# Patient Record
Sex: Male | Born: 2000 | Race: Black or African American | Hispanic: No | Marital: Single | Smoking: Never smoker
Health system: Southern US, Community
[De-identification: ages and names within clinical notes are randomized; demographics above are authoritative.]

## PROBLEM LIST (undated history)

## (undated) HISTORY — PX: TENDON REPAIR: SHX5111

---
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
## 2019-06-12 ENCOUNTER — Other Ambulatory Visit: Payer: Self-pay

## 2019-06-13 ENCOUNTER — Ambulatory Visit: Payer: No Typology Code available for payment source | Admitting: Physician Assistant

## 2019-06-13 ENCOUNTER — Encounter: Payer: Self-pay | Admitting: Physician Assistant

## 2019-06-13 VITALS — BP 116/63 | HR 54 | Temp 97.2°F | Ht 69.0 in | Wt 205.2 lb

## 2019-06-13 DIAGNOSIS — Z00129 Encounter for routine child health examination without abnormal findings: Secondary | ICD-10-CM

## 2019-06-13 DIAGNOSIS — Z0000 Encounter for general adult medical examination without abnormal findings: Secondary | ICD-10-CM | POA: Diagnosis not present

## 2019-06-14 NOTE — Progress Notes (Signed)
Adolescent Well Care Visit Andrew Austin is a 18 y.o. male who is here for well care.    PCP:  Remus LofflerJones, Orlanda Lemmerman S, PA-C   History was provided by the patient.  Current Issues: Current concerns include Going to secondary school that will update his classes and be a football prep program. He hops to move on to higher level..   Nutrition: Nutrition/Eating Behaviors: good Adequate calcium in diet?: good Supplements/ Vitamins: no  Exercise/ Media: Play any Sports?/ Exercise: football, high level Screen Time:  < 2 hours Media Rules or Monitoring?: no  Sleep:  Sleep: good  Social Screening: Lives with:  parents Parental relations:  good Activities, Work, and Regulatory affairs officerChores?: yes Concerns regarding behavior with peers?  no Stressors of note: no  Education: School Name: completed high school  School Grade: finished School performance: doing well; no concerns School Behavior: doing well; no concerns  Confidential Social History: Tobacco?  no Secondhand smoke exposure?  no Drugs/ETOH?  no  Sexually Active?  no   Pregnancy Prevention: discussed  Safe at home, in school & in relationships?  Yes Safe to self?  Yes   Screenings: Patient has a dental home: yes  The patient completed the Rapid Assessment for Adolescent Preventive Services screening questionnaire and the following topics were identified as risk factors and discussed: condom use and mental health issues  In addition, the following topics were discussed as part of anticipatory guidance healthy eating and exercise.  PHQ-9 completed and results indicated  [facility] 06/13/2019 08/12/2017 06/22/2017 02/17/2017 08/09/2016  Decreased Interest 0 0 0 1 0  Down, Depressed, Hopeless 0 0 0 0 0  PHQ - 2 Score 0 0 0 1 0  Altered sleeping 0 - 0 - -  Tired, decreased energy 0 - 0 - -  Change in appetite 0 - 0 - -  Feeling bad or failure about yourself  0 - 0 - -  Trouble concentrating 0 - 0 - -  Moving slowly or  fidgety/restless 0 - 0 - -  Suicidal thoughts 0 - 0 - -  PHQ-9 Score 0 - 0 - -     Physical Exam:  Vitals:   06/13/19 1419  BP: 116/63  Pulse: (!) 54  Temp: (!) 97.2 F (36.2 C)  TempSrc: Oral  Weight: 205 lb 3.2 oz (93.1 kg)  Height: 5\' 9"  (1.753 m)   BP 116/63   Pulse (!) 54   Temp (!) 97.2 F (36.2 C) (Oral)   Ht 5\' 9"  (1.753 m)   Wt 205 lb 3.2 oz (93.1 kg)   BMI 30.30 kg/m  Body mass index: body mass index is 30.3 kg/m. Blood pressure percentiles are not available for patients who are 18 years or older.   Hearing Screening   125Hz  250Hz  500Hz  1000Hz  2000Hz  3000Hz  4000Hz  6000Hz  8000Hz   Right ear:           Left ear:             Visual Acuity Screening   Right eye Left eye Both eyes  Without correction:     With correction: 20/20 20/40 20/20     General Appearance:   alert, oriented, no acute distress and well nourished  HENT: Normocephalic, no obvious abnormality, conjunctiva clear  Mouth:   Normal appearing teeth, no obvious discoloration, dental caries, or dental caps  Neck:   Supple; thyroid: no enlargement, symmetric, no tenderness/mass/nodules  Chest Normal, RRR without murmurs, rubs or galps  Lungs:   Clear to auscultation bilaterally, normal work of breathing  Heart:   Regular rate and rhythm, S1 and S2 normal, no murmurs;   Abdomen:   Soft, non-tender, no mass, or organomegaly  GU genitalia not examined  Musculoskeletal:   Tone and strength strong and symmetrical, all extremities               Lymphatic:   No cervical adenopathy  Skin/Hair/Nails:   Skin warm, dry and intact, no rashes, no bruises or petechiae  Neurologic:   Strength, gait, and coordination normal and age-appropriate     Assessment and Plan:   Well 17 year exam  BMI is appropriate for age  Hearing screening result:normal Vision screening result: normal  Counseling provided for all of the vaccine components   Declines vaccines at this time  No follow-ups on file.Terald Sleeper, PA-C

## 2020-06-13 ENCOUNTER — Ambulatory Visit (INDEPENDENT_AMBULATORY_CARE_PROVIDER_SITE_OTHER): Payer: Medicaid Other | Admitting: Nurse Practitioner

## 2020-06-13 ENCOUNTER — Encounter: Payer: Self-pay | Admitting: Nurse Practitioner

## 2020-06-13 ENCOUNTER — Other Ambulatory Visit: Payer: Self-pay

## 2020-06-13 VITALS — BP 125/68 | HR 65 | Temp 98.1°F | Ht 70.0 in | Wt 228.6 lb

## 2020-06-13 DIAGNOSIS — Z00129 Encounter for routine child health examination without abnormal findings: Secondary | ICD-10-CM | POA: Diagnosis not present

## 2020-06-13 DIAGNOSIS — Z0000 Encounter for general adult medical examination without abnormal findings: Secondary | ICD-10-CM | POA: Diagnosis not present

## 2020-06-13 NOTE — Assessment & Plan Note (Signed)
Patient is a 19 year old male who presents for encounter general adult medical exam without abnormal findings/sports physical.  Patient has no new concerns.  Patient is a healthy 19 year old with a positive self image.  Health assessment completed.  Sports physical completed.  Forms filled.  Provided education on health maintenance, safety, diet and exercise.  Labs completed, CBC,CMP lipids and sickle cell panel.

## 2020-06-13 NOTE — Patient Instructions (Signed)

## 2020-06-13 NOTE — Progress Notes (Signed)
Adolescent Well Care Visit Andrew Austin is a 19 y.o. male who is here for well care.    PCP:  Gwenlyn Fudge, FNP   History was provided by the: Patient  Confidentiality was discussed with the patient and, if applicable, with caregiver as well. Patient's personal or confidential phone number: [phone]   Current Issues: Current concerns include No  Nutrition: Nutrition/Eating Behaviors: Balanced diet Adequate calcium in diet?: Yes Supplements/ Vitamins: Yes  Exercise/ Media: Play any Sports?/ Exercise: Yes Screen Time:  3 hrs  Media Rules or Monitoring?: No  Sleep:  Sleep: 7-8 hrs  Social Screening: Lives with:Parents Parental relations:  good Activities, Work, and Regulatory affairs officer?: taking trash, cleaning, washing dish Concerns regarding behavior with peers?  No Stressors of note: No  Education: School Name: [employer] School Grade: Fresh man School performance: Good School Behavior: good    Confidential Social History: Tobacco? No Secondhand smoke exposure? No Drugs/ETOH?  No  Sexually Active?  Abstenance    Safe at home, in school & in relationships?  yes Safe to self?  Yes  Screenings: Patient has a dental home: Yes  The patient completed the Rapid Assessment for Adolescent Preventive Services screening questionnaire and the following topics were identified as risk factors and discussed: None In addition, the following topics were discussed as part of anticipatory guidance: Safety,   PHQ-9 completed and results indicated:    Office Visit from 06/13/2020 in [facility]  PHQ-9 Total Score 0      Physical Exam:  Vitals:   06/13/20 1124  BP: 125/68  Pulse: 65  Temp: 98.1 F (36.7 C)  TempSrc: Temporal  Weight: 228 lb 9.6 oz (103.7 kg)  Height: 5\' 10"  (1.778 m)   BP 125/68   Pulse 65   Temp 98.1 F (36.7 C) (Temporal)   Ht 5\' 10"  (1.778 m)   Wt 228 lb 9.6 oz (103.7 kg)   BMI 32.80 kg/m  Body mass index: body  mass index is 32.8 kg/m. Blood pressure percentiles are not available for patients who are 18 years or older.   Hearing Screening   125Hz  250Hz  500Hz  1000Hz  2000Hz  3000Hz  4000Hz  6000Hz  8000Hz   Right ear:           Left ear:             Visual Acuity Screening   Right eye Left eye Both eyes  Without correction:     With correction: 20/20 20/20 20/20     General Appearance:   alert, oriented, no acute distress  HENT: Normocephalic, no obvious abnormality, conjunctiva clear  Mouth:   Normal appearing teeth, no obvious discoloration, dental caries, or dental caps  Neck:   Supple; thyroid: no enlargement, symmetric, no tenderness/mass/nodules  Chest symetrical  Lungs:   Clear to auscultation bilaterally, normal work of breathing  Heart:   Regular rate and rhythm, S1 and S2 normal, no murmurs;   Abdomen:   Soft, non-tender, no mass, or organomegaly  GU genitalia not examined  Musculoskeletal:   Tone and strength strong and symmetrical, all extremities               Lymphatic:   No cervical adenopathy  Skin/Hair/Nails:   Skin warm, dry and intact, no rashes, no bruises or petechiae  Neurologic:   Strength, gait, and coordination normal and age-appropriate     Assessment and Plan:   Encounter for general adult medical examination without abnormal findings Patient is a 19 year old male who presents  for encounter general adult medical exam without abnormal findings/sports physical.  Patient has no new concerns.  Patient is a healthy 19 year old with a positive self image.  Health assessment completed.  Sports physical completed.  Forms filled.  Provided education on health maintenance, safety, diet and exercise.  Labs completed, CBC,CMP lipids and sickle cell panel.   BMI not appropriate for age  Hearing screening result:normal Vision screening result: normal     Return in about 1 year (around 06/13/2021).Daryll Drown, NP

## 2020-06-14 LAB — CMP14+CBC/D/PLT+FER+RETIC+V...
ALT: 77 IU/L — ABNORMAL HIGH (ref 0–44)
AST: 56 IU/L — ABNORMAL HIGH (ref 0–40)
Albumin/Globulin Ratio: 2.2 (ref 1.2–2.2)
Albumin: 5 g/dL (ref 4.1–5.2)
Alkaline Phosphatase: 84 IU/L (ref 55–125)
BUN/Creatinine Ratio: 23 — ABNORMAL HIGH (ref 9–20)
BUN: 28 mg/dL — ABNORMAL HIGH (ref 6–20)
Basophils Absolute: 0 10*3/uL (ref 0.0–0.2)
Basos: 1 %
Bilirubin Total: 0.3 mg/dL (ref 0.0–1.2)
CO2: 22 mmol/L (ref 20–29)
Calcium: 9.5 mg/dL (ref 8.7–10.2)
Chloride: 104 mmol/L (ref 96–106)
Creatinine, Ser: 1.22 mg/dL (ref 0.76–1.27)
EOS (ABSOLUTE): 0.1 10*3/uL (ref 0.0–0.4)
Eos: 1 %
Ferritin: 57 ng/mL (ref 16–124)
GFR calc Af Amer: 99 mL/min/{1.73_m2} (ref >59)
GFR calc non Af Amer: 85 mL/min/{1.73_m2} (ref >59)
Globulin, Total: 2.3 g/dL (ref 1.5–4.5)
Glucose: 81 mg/dL (ref 65–99)
Hematocrit: 48.2 % (ref 37.5–51.0)
Hemoglobin: 15.4 g/dL (ref 13.0–17.7)
Immature Grans (Abs): 0 10*3/uL (ref 0.0–0.1)
Immature Granulocytes: 0 %
Lymphocytes Absolute: 2 10*3/uL (ref 0.7–3.1)
Lymphs: 40 %
MCH: 29.5 pg (ref 26.6–33.0)
MCHC: 32 g/dL (ref 31.5–35.7)
MCV: 92 fL (ref 79–97)
Monocytes Absolute: 0.4 10*3/uL (ref 0.1–0.9)
Monocytes: 8 %
Neutrophils Absolute: 2.5 10*3/uL (ref 1.4–7.0)
Neutrophils: 50 %
Platelets: 163 10*3/uL (ref 150–450)
Potassium: 4.5 mmol/L (ref 3.5–5.2)
RBC: 5.22 x10E6/uL (ref 4.14–5.80)
RDW: 13.2 % (ref 11.6–15.4)
Retic Ct Pct: 1.7 % (ref 0.6–2.6)
Sodium: 139 mmol/L (ref 134–144)
Total Protein: 7.3 g/dL (ref 6.0–8.5)
Vit D, 25-Hydroxy: 32.2 ng/mL (ref 30.0–100.0)
WBC: 5 10*3/uL (ref 3.4–10.8)

## 2020-06-14 LAB — LIPID PANEL
Chol/HDL Ratio: 4.3 ratio (ref 0.0–5.0)
Cholesterol, Total: 177 mg/dL — ABNORMAL HIGH (ref 100–169)
HDL: 41 mg/dL (ref >39)
LDL Chol Calc (NIH): 127 mg/dL — ABNORMAL HIGH (ref 0–109)
Triglycerides: 48 mg/dL (ref 0–89)
VLDL Cholesterol Cal: 9 mg/dL (ref 5–40)

## 2020-06-19 ENCOUNTER — Telehealth: Payer: Self-pay | Admitting: Family Medicine

## 2020-06-19 NOTE — Telephone Encounter (Signed)
Attempted to contact patient - NA °

## 2020-06-19 NOTE — Telephone Encounter (Signed)
Pt returned missed call from [facility] regarding lab results. Reviewed results with pt per Je's notes. Pt voiced understanding. Pt says he will call back to make his 3 mth appointment to have labs rechecked once he gets his school schedule.

## 2020-06-19 NOTE — Telephone Encounter (Signed)
Pt calling for lab results.

## 2020-06-30 ENCOUNTER — Telehealth: Payer: Self-pay | Admitting: Family Medicine

## 2020-06-30 NOTE — Telephone Encounter (Signed)
Printed labs off and put up front

## 2021-01-08 ENCOUNTER — Ambulatory Visit (INDEPENDENT_AMBULATORY_CARE_PROVIDER_SITE_OTHER): Payer: Self-pay | Admitting: *Deleted

## 2021-01-08 ENCOUNTER — Other Ambulatory Visit: Payer: Self-pay

## 2021-01-08 DIAGNOSIS — Z23 Encounter for immunization: Secondary | ICD-10-CM

## 2021-04-07 ENCOUNTER — Encounter: Payer: Self-pay | Admitting: Nurse Practitioner

## 2021-04-07 ENCOUNTER — Ambulatory Visit (INDEPENDENT_AMBULATORY_CARE_PROVIDER_SITE_OTHER): Payer: No Typology Code available for payment source | Admitting: Nurse Practitioner

## 2021-04-07 ENCOUNTER — Other Ambulatory Visit: Payer: Self-pay

## 2021-04-07 VITALS — BP 136/74 | HR 42 | Temp 98.5°F | Ht 70.0 in | Wt 208.0 lb

## 2021-04-07 DIAGNOSIS — L600 Ingrowing nail: Secondary | ICD-10-CM | POA: Diagnosis not present

## 2021-04-07 NOTE — Assessment & Plan Note (Signed)
Removed over lapping Toe nail from nail bed. Patient tolerated well and knows to follow up with worsening or unresolved symptoms

## 2021-04-07 NOTE — Patient Instructions (Addendum)
Fingernail or Toenail Removal, Adult  Fingernail or toenail removal is a procedure in which a health care provider removes a person's nail. This may be done because of an injury, accident, or medical condition. Removal of a nail may be necessary when the nail is ingrown, infected, or damaged, or when it has not grown properly. Tell a health care provider about:  Any allergies you have.  All medicines you are taking, including vitamins, herbs, eye drops, creams, and over-the-counter medicines.  Any problems you or family members have had with anesthetic medicines.  Any blood disorders you have.  Any surgeries you have had.  Any medical conditions you have.  Whether you are pregnant or may be pregnant. What are the risks? Generally, this is a safe procedure. However, problems may occur, including:  Pain.  Bleeding.  Infection.  Allergic reactions to medicines.  Damage to nearby structures.  The nail growing back improperly. What happens before the procedure? Medicines Ask your health care provider about:  Changing or stopping your regular medicines. This is especially important if you are taking diabetes medicines or blood thinners.  Taking medicines such as aspirin and ibuprofen. These medicines can thin your blood. Do not take these medicines unless your health care provider tells you to take them.  Taking over-the-counter medicines, vitamins, herbs, and supplements. General instructions Ask your health care provider what steps will be taken to help prevent infection. These may include:  Removing hair at the surgery site.  Washing skin with a germ-killing soap.  Receiving antibiotic medicine. What happens during the procedure?  You will be given a medicine to numb the area (local anesthetic).  An instrument will be inserted underneath the nail to lift it up.  An incision may be made in your nail.  The nail will be removed.  A bandage (dressing) will be put  over the area where the nail was removed. The procedure may vary among health care providers and hospitals. What happens after the procedure?  Your blood pressure, heart rate, breathing rate, and blood oxygen level will be monitored until you leave the hospital or clinic.  If you had a fingernail removed, you may be given a finger splint to wear while you recover.  If you had a toenail removed, you may be given a surgical shoe to wear while you recover.  You may need to keep your hand or foot raised (elevated) or supported on a pillow for 24 hours or as long as told by your health care provider. Summary  Removal of a nail may be necessary when the nail is ingrown, infected, or damaged, or when it has not grown properly.  Before the procedure, tell your health care provider about all medicines you take and any medical conditions you have.  You will be given medicine to numb the area, and the nail will be removed.  After a fingernail is removed, you may be given a finger splint to wear while you recover.  After a toenail is removed, you may be given a surgical shoe to wear while you recover. This information is not intended to replace advice given to you by your health care provider. Make sure you discuss any questions you have with your health care provider. Document Revised: 07/09/2019 Document Reviewed: 07/09/2019 Elsevier Patient Education  2021 [employer].

## 2021-04-07 NOTE — Progress Notes (Signed)
Acute Office Visit  Subjective:    Patient ID: Andrew Austin, male    DOB: Oct 29, 2001, 20 y.o.   MRN: [id number]  Chief Complaint  Patient presents with  . Toe Pain    HPI Patient is in today for Pain  He reports chronic right great toe pain. was an injury that may have caused the pain. The pain started several months ago and is rapidly improving. The pain does not radiate . The pain is described as aching, is mild in intensity, occurring intermittently.   Aggravating factors: walking Relieving factors: none.  He has tried application of ice with mild relief.   ---------------------------------------------------------------------------------------------------     Family History  Problem Relation Age of Onset  . Healthy Mother   . Healthy Father   . Healthy Brother     Social History   Socioeconomic History  . Marital status: Single    Spouse name: Not on file  . Number of children: Not on file  . Years of education: Not on file  . Highest education level: Not on file  Occupational History  . Not on file  Tobacco Use  . Smoking status: Never Smoker  . Smokeless tobacco: Never Used  Vaping Use  . Vaping Use: Never used  Substance and Sexual Activity  . Alcohol use: No  . Drug use: No  . Sexual activity: Never  Other Topics Concern  . Not on file  Social History Narrative  . Not on file   Social Determinants of Health   Financial Resource Strain: Not on file  Food Insecurity: Not on file  Transportation Needs: Not on file  Physical Activity: Not on file  Stress: Not on file  Social Connections: Not on file  Intimate Partner Violence: Not on file    No outpatient medications prior to visit.   No facility-administered medications prior to visit.    No Known Allergies  Review of Systems  Constitutional: Negative.   HENT: Negative.   Respiratory: Negative.   Cardiovascular: Negative.   Gastrointestinal: Negative.   All other systems reviewed  and are negative.      Objective:    Physical Exam Vitals reviewed.  Constitutional:      Appearance: Normal appearance.  HENT:     Head: Normocephalic.     Nose: Nose normal.  Eyes:     Conjunctiva/sclera: Conjunctivae normal.  Cardiovascular:     Rate and Rhythm: Normal rate and regular rhythm.     Pulses: Normal pulses.     Heart sounds: Normal heart sounds.  Pulmonary:     Effort: Pulmonary effort is normal.     Breath sounds: Normal breath sounds.  Abdominal:     General: Bowel sounds are normal.  Feet:     Right foot:     Skin integrity: Skin integrity normal.     Toenail Condition: Right toenails are abnormally thick and ingrown.  Skin:    Findings: No rash.  Neurological:     Mental Status: He is alert and oriented to person, place, and time.  Psychiatric:        Behavior: Behavior normal.     BP 136/74   Pulse (!) 42   Temp 98.5 F (36.9 C)   Ht 5\' 10"  (1.778 m)   Wt 208 lb (94.3 kg)   SpO2 99%   BMI 29.84 kg/m  Wt Readings from Last 3 Encounters:  04/07/21 208 lb (94.3 kg)  06/13/20 228 lb 9.6 oz (103.7 kg) (  98 %, Z= 2.03)*  06/13/19 205 lb 3.2 oz (93.1 kg) (95 %, Z= 1.62)*   * Growth percentiles are based on CDC (Boys, 2-20 Years) data.    Health Maintenance Due  Topic Date Due  . COVID-19 Vaccine (1) Never done  . HPV VACCINES (1 - Male 2-dose series) Never done  . HIV Screening  Never done  . Hepatitis C Screening  Never done       Topic Date Due  . HPV VACCINES (1 - Male 2-dose series) Never done     No results found for: TSH Lab Results  Component Value Date   WBC 5.0 06/13/2020   HGB 15.4 06/13/2020   HCT 48.2 06/13/2020   MCV 92 06/13/2020   PLT 163 06/13/2020   Lab Results  Component Value Date   NA 139 06/13/2020   K 4.5 06/13/2020   CO2 22 06/13/2020   GLUCOSE 81 06/13/2020   BUN 28 (H) 06/13/2020   CREATININE 1.22 06/13/2020   BILITOT 0.3 06/13/2020   ALKPHOS 84 06/13/2020   AST 56 (H) 06/13/2020   ALT 77 (H)  06/13/2020   PROT 7.3 06/13/2020   ALBUMIN 5.0 06/13/2020   CALCIUM 9.5 06/13/2020   Lab Results  Component Value Date   CHOL 177 (H) 06/13/2020   Lab Results  Component Value Date   HDL 41 06/13/2020   Lab Results  Component Value Date   LDLCALC 127 (H) 06/13/2020   Lab Results  Component Value Date   TRIG 48 06/13/2020   Lab Results  Component Value Date   CHOLHDL 4.3 06/13/2020   No results found for: HGBA1C     Assessment & Plan:   Problem List Items Addressed This Visit      Musculoskeletal and Integument   Ingrown nail of great toe of right foot - Primary    Removed over lapping Toe nail from nail bed. Patient tolerated well and knows to follow up with worsening or unresolved symptoms          No orders of the defined types were placed in this encounter.    Daryll Drown, NP

## 2021-06-15 ENCOUNTER — Ambulatory Visit (INDEPENDENT_AMBULATORY_CARE_PROVIDER_SITE_OTHER): Payer: Medicaid Other | Admitting: Nurse Practitioner

## 2021-06-15 ENCOUNTER — Encounter: Payer: Self-pay | Admitting: Nurse Practitioner

## 2021-06-15 ENCOUNTER — Other Ambulatory Visit: Payer: Self-pay

## 2021-06-15 DIAGNOSIS — Z0000 Encounter for general adult medical examination without abnormal findings: Secondary | ICD-10-CM

## 2021-06-15 NOTE — Patient Instructions (Signed)
Health Maintenance, Male Adopting a healthy lifestyle and getting preventive care are important in promoting health and wellness. Ask your health care provider about: The right schedule for you to have regular tests and exams. Things you can do on your own to prevent diseases and keep yourself healthy. What should I know about diet, weight, and exercise? Eat a healthy diet  Eat a diet that includes plenty of vegetables, fruits, low-fat dairy products, and lean protein. Do not eat a lot of foods that are high in solid fats, added sugars, or sodium.  Maintain a healthy weight Body mass index (BMI) is a measurement that can be used to identify possible weight problems. It estimates body fat based on height and weight. Your health care provider can help determine your BMI and help you achieve or maintain ahealthy weight. Get regular exercise Get regular exercise. This is one of the most important things you can do for your health. Most adults should: Exercise for at least 150 minutes each week. The exercise should increase your heart rate and make you sweat (moderate-intensity exercise). Do strengthening exercises at least twice a week. This is in addition to the moderate-intensity exercise. Spend less time sitting. Even light physical activity can be beneficial. Watch cholesterol and blood lipids Have your blood tested for lipids and cholesterol at 20 years of age, then havethis test every 5 years. You may need to have your cholesterol levels checked more often if: Your lipid or cholesterol levels are high. You are older than 21 years of age. You are at high risk for heart disease. What should I know about cancer screening? Many types of cancers can be detected early and may often be prevented. Depending on your health history and family history, you may need to have cancer screening at various ages. This may include screening for: Colorectal cancer. Prostate cancer. Skin cancer. Lung  cancer. What should I know about heart disease, diabetes, and high blood pressure? Blood pressure and heart disease High blood pressure causes heart disease and increases the risk of stroke. This is more likely to develop in people who have high blood pressure readings, are of African descent, or are overweight. Talk with your health care provider about your target blood pressure readings. Have your blood pressure checked: Every 3-5 years if you are 18-39 years of age. Every year if you are 40 years old or older. If you are between the ages of 65 and 75 and are a current or former smoker, ask your health care provider if you should have a one-time screening for abdominal aortic aneurysm (AAA). Diabetes Have regular diabetes screenings. This checks your fasting blood sugar level. Have the screening done: Once every three years after age 45 if you are at a normal weight and have a low risk for diabetes. More often and at a younger age if you are overweight or have a high risk for diabetes. What should I know about preventing infection? Hepatitis B If you have a higher risk for hepatitis B, you should be screened for this virus. Talk with your health care provider to find out if you are at risk forhepatitis B infection. Hepatitis C Blood testing is recommended for: Everyone born from 1945 through 1965. Anyone with known risk factors for hepatitis C. Sexually transmitted infections (STIs) You should be screened each year for STIs, including gonorrhea and chlamydia, if: You are sexually active and are younger than 21 years of age. You are older than 21 years of age   and your health care provider tells you that you are at risk for this type of infection. Your sexual activity has changed since you were last screened, and you are at increased risk for chlamydia or gonorrhea. Ask your health care provider if you are at risk. Ask your health care provider about whether you are at high risk for HIV.  Your health care provider may recommend a prescription medicine to help prevent HIV infection. If you choose to take medicine to prevent HIV, you should first get tested for HIV. You should then be tested every 3 months for as long as you are taking the medicine. Follow these instructions at home: Lifestyle Do not use any products that contain nicotine or tobacco, such as cigarettes, e-cigarettes, and chewing tobacco. If you need help quitting, ask your health care provider. Do not use street drugs. Do not share needles. Ask your health care provider for help if you need support or information about quitting drugs. Alcohol use Do not drink alcohol if your health care provider tells you not to drink. If you drink alcohol: Limit how much you have to 0-2 drinks a day. Be aware of how much alcohol is in your drink. In the [place] one drink equals one 12 oz bottle of beer (355 mL), one 5 oz glass of wine (148 mL), or one 1 oz glass of hard liquor (44 mL). General instructions Schedule regular health, dental, and eye exams. Stay current with your vaccines. Tell your health care provider if: You often feel depressed. You have ever been abused or do not feel safe at home. Summary Adopting a healthy lifestyle and getting preventive care are important in promoting health and wellness. Follow your health care provider's instructions about healthy diet, exercising, and getting tested or screened for diseases. Follow your health care provider's instructions on monitoring your cholesterol and blood pressure. This information is not intended to replace advice given to you by your health care provider. Make sure you discuss any questions you have with your healthcare provider. Document Revised: 11/08/2018 Document Reviewed: 11/08/2018 Elsevier Patient Education  2022 Elsevier Inc.  

## 2021-06-15 NOTE — Assessment & Plan Note (Signed)
Satisfactory school sports physical exam.  No restrictions.  Forms signed.  Follow-up in 1 year.

## 2021-06-15 NOTE — Progress Notes (Signed)
Subjective:     Andrew Austin is a 20 y.o. male who presents for a school sports physical exam. Patient/parent deny any current health related concerns.  He plans to participate in Football  Immunization History  Administered Date(s) Administered   DTaP 12/14/2000, 02/08/2001, 04/20/2001, 02/13/2002   Hepatitis A 10/15/2005   Hepatitis B 10/19/00, 12/14/2000, 06/20/2001   HiB (PRP-OMP) 12/14/2000, 02/08/2001, 04/20/2001, 02/13/2002   IPV 12/14/2000, 02/08/2001, 02/13/2002   Influenza-Unspecified 09/23/2008, 10/29/2008, 11/12/2009   MMR 10/12/2001   Pneumococcal Conjugate-13 12/14/2000, 02/08/2001, 04/20/2001   Tdap 07/03/2012   Varicella 10/12/2001, 01/08/2021    The following portions of the patient's history were reviewed and updated as appropriate: allergies, current medications, past family history, past medical history, past social history, past surgical history, and problem list.  Review of Systems Pertinent items are noted in HPI    Objective:    BP 131/65   Pulse (!) 45   Temp 98.1 F (36.7 C) (Temporal)   Ht 5' 10"  (1.778 m)   Wt 206 lb (93.4 kg)   SpO2 100%   BMI 29.56 kg/m   General Appearance:  Alert, cooperative, no distress, appropriate for age                            Head:  Normocephalic, no obvious abnormality                             Eyes:  PERRL, EOM's intact, conjunctiva and corneas clear, fundi benign, both eyes                             Nose:  Nares symmetrical, septum midline, mucosa pink, clear watery discharge; no sinus tenderness                          Throat:  Lips, tongue, and mucosa are moist, pink, and intact; teeth intact                             Neck:  Supple, symmetrical, trachea midline, no adenopathy; thyroid: no enlargement, symmetric,no tenderness/mass/nodules; no carotid bruit, no JVD                             Back:  Symmetrical, no curvature, ROM normal, no CVA tenderness               Chest/Breast:  No mass or  tenderness                           Lungs:  Clear to auscultation bilaterally, respirations unlabored                             Heart:  Normal PMI, regular rate & rhythm, S1 and S2 normal, no murmurs, rubs, or gallops                     Abdomen:  Soft, non-tender, bowel sounds active all four quadrants, no mass, or organomegaly              Genitourinary:  Normal male, testes descended, no discharge, swelling, or pain  Musculoskeletal:  Tone and strength strong and symmetrical, all extremities                    Lymphatic:  No adenopathy            Skin/Hair/Nails:  Skin warm, dry, and intact, no rashes or abnormal dyspigmentation                  Neurologic:  Alert and oriented x3, no cranial nerve deficits, normal strength and tone, gait steady   Assessment:    Satisfactory school sports physical exam.     Plan:    Permission granted to participate in athletics without restrictions. Form signed and returned to patient. Anticipatory guidance: Gave handout on well-child issues at this age.

## 2022-06-21 ENCOUNTER — Encounter: Payer: Medicaid Other | Admitting: Nurse Practitioner

## 2022-07-12 ENCOUNTER — Encounter: Payer: Medicaid Other | Admitting: Nurse Practitioner

## 2022-10-29 DIAGNOSIS — X58XXXA Exposure to other specified factors, initial encounter: Secondary | ICD-10-CM | POA: Diagnosis not present

## 2022-10-29 DIAGNOSIS — M25642 Stiffness of left hand, not elsewhere classified: Secondary | ICD-10-CM | POA: Diagnosis not present

## 2022-10-29 DIAGNOSIS — Z789 Other specified health status: Secondary | ICD-10-CM | POA: Diagnosis not present

## 2022-10-29 DIAGNOSIS — S63639D Sprain of interphalangeal joint of unspecified finger, subsequent encounter: Secondary | ICD-10-CM | POA: Diagnosis not present

## 2022-10-29 DIAGNOSIS — L905 Scar conditions and fibrosis of skin: Secondary | ICD-10-CM | POA: Diagnosis not present

## 2022-10-29 DIAGNOSIS — S63639A Sprain of interphalangeal joint of unspecified finger, initial encounter: Secondary | ICD-10-CM | POA: Diagnosis not present

## 2022-11-01 DIAGNOSIS — Z789 Other specified health status: Secondary | ICD-10-CM | POA: Diagnosis not present

## 2022-11-01 DIAGNOSIS — S63639D Sprain of interphalangeal joint of unspecified finger, subsequent encounter: Secondary | ICD-10-CM | POA: Diagnosis not present

## 2022-11-01 DIAGNOSIS — X58XXXA Exposure to other specified factors, initial encounter: Secondary | ICD-10-CM | POA: Diagnosis not present

## 2022-11-01 DIAGNOSIS — M25642 Stiffness of left hand, not elsewhere classified: Secondary | ICD-10-CM | POA: Diagnosis not present

## 2022-11-01 DIAGNOSIS — S63639A Sprain of interphalangeal joint of unspecified finger, initial encounter: Secondary | ICD-10-CM | POA: Diagnosis not present

## 2022-11-01 DIAGNOSIS — L905 Scar conditions and fibrosis of skin: Secondary | ICD-10-CM | POA: Diagnosis not present

## 2022-11-09 DIAGNOSIS — S63639A Sprain of interphalangeal joint of unspecified finger, initial encounter: Secondary | ICD-10-CM | POA: Diagnosis not present

## 2022-11-09 DIAGNOSIS — L905 Scar conditions and fibrosis of skin: Secondary | ICD-10-CM | POA: Diagnosis not present

## 2022-11-09 DIAGNOSIS — S63639D Sprain of interphalangeal joint of unspecified finger, subsequent encounter: Secondary | ICD-10-CM | POA: Diagnosis not present

## 2022-11-09 DIAGNOSIS — M25642 Stiffness of left hand, not elsewhere classified: Secondary | ICD-10-CM | POA: Diagnosis not present

## 2022-11-09 DIAGNOSIS — X58XXXA Exposure to other specified factors, initial encounter: Secondary | ICD-10-CM | POA: Diagnosis not present

## 2022-11-09 DIAGNOSIS — Z789 Other specified health status: Secondary | ICD-10-CM | POA: Diagnosis not present

## 2023-06-14 ENCOUNTER — Ambulatory Visit (INDEPENDENT_AMBULATORY_CARE_PROVIDER_SITE_OTHER): Payer: Commercial Managed Care - PPO | Admitting: Nurse Practitioner

## 2023-06-14 ENCOUNTER — Encounter: Payer: Self-pay | Admitting: Nurse Practitioner

## 2023-06-14 VITALS — BP 118/78 | HR 50 | Temp 98.0°F | Ht 70.0 in | Wt 197.0 lb

## 2023-06-14 DIAGNOSIS — Z23 Encounter for immunization: Secondary | ICD-10-CM | POA: Diagnosis not present

## 2023-06-14 DIAGNOSIS — Z0000 Encounter for general adult medical examination without abnormal findings: Secondary | ICD-10-CM

## 2023-06-14 DIAGNOSIS — Z025 Encounter for examination for participation in sport: Secondary | ICD-10-CM

## 2023-06-14 LAB — CBC WITH DIFFERENTIAL/PLATELET
Basophils Absolute: 0 10*3/uL (ref 0.0–0.2)
EOS (ABSOLUTE): 0.1 10*3/uL (ref 0.0–0.4)
Hematocrit: 41.1 % (ref 37.5–51.0)
Hemoglobin: 13.4 g/dL (ref 13.0–17.7)
Lymphocytes Absolute: 2.3 10*3/uL (ref 0.7–3.1)
Lymphs: 46 %

## 2023-06-14 LAB — CMP14+EGFR
Glucose: 82 mg/dL (ref 70–99)
Sodium: 139 mmol/L (ref 134–144)

## 2023-06-14 LAB — HEPB+HEPC+HIV PANEL

## 2023-06-14 LAB — THYROID PANEL WITH TSH

## 2023-06-14 LAB — BAYER DCA HB A1C WAIVED: HB A1C (BAYER DCA - WAIVED): 5.4 % (ref 4.8–5.6)

## 2023-06-14 LAB — LIPID PANEL
Chol/HDL Ratio: 3.8 ratio (ref 0.0–5.0)
VLDL Cholesterol Cal: 6 mg/dL (ref 5–40)

## 2023-06-14 NOTE — Progress Notes (Signed)
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 He deny any current health related concerns. He plans to participate in [employer].  Personal history -Exertional chest pain/discomfort: Denies -Unexplained syncope/near-syncope: Denies -Excessive exertional and unexplained dyspnea/fatigue associated with exercise: Denies -Prior recognition of a heart murmur: Denies -Elevated systemic blood pressure: Denies  Current diet: Regular "organic, no process food ot Junk Balanced diet? yes  Current concerns include: None. Sexually active? no Does patient snore? yes - " when I am really tired"  Risk factors for anemia: no Risk factors for vision problems: no wear correctives since 7 yrs Risk factors for hearing problems: no Risk factors for tuberculosis: no Risk factors for dyslipidemia: no Risk factors for sexually-transmitted infections: no, not sexually active  Family history -Premature death (sudden and unexpected, or otherwise) before age 35 years due to heart disease in first-degree relative Denies -Disability from heart disease in a close relative younger than 23 years of age denies -Specific knowledge of certain cardiac conditions in family members: hypertrophic or dilated cardiomyopathy, long QT syndrome and other ion channelopathies, Marfan syndrome, and clinically important arrhythmias denies  Social history Do you feel stressed out or under a lot of pressure? Do you ever feel sad, hopeless, depressed, or anxious? Do you feel safe at your home or residence? Have you ever tried cigarettes, chewing tobacco, snuff, or dip? During the past 30 days, did you use chewing tobacco, snuff, or dip? Do you drink alcohol or use any other drugs? Have you ever taken anabolic steroids or used any other performance supplement? Have you ever taken any supplements to help you gain or lose weight or improve your performance? Do you wear a seat  belt, use a helmet, and use condoms?  Parental relations: with mother Sibling relations: brothers: one and sisters: one Discipline concerns? no Concerns regarding behavior with peers? no School performance: doing well; no concerns Secondhand smoke exposure? no   Immunization History  Administered Date(s) Administered   DTaP 12/14/2000, 02/08/2001, 04/20/2001, 02/13/2002   HIB (PRP-OMP) 12/14/2000, 02/08/2001, 04/20/2001, 02/13/2002   Hepatitis A 10/15/2005   Hepatitis B 1999[birth year]12[birth year], 12/14/2000, 06/20/2001   IPV 12/14/2000, 02/08/2001, 02/13/2002   Influenza-Unspecified 09/23/2008, 10/29/2008, 11/12/2009   MMR 10/12/2001   Pneumococcal Conjugate-13 12/14/2000, 02/08/2001, 04/20/2001   Tdap 07/03/2012, 06/14/2023   Varicella 10/12/2001, 01/08/2021    The following portions of the patient's history were reviewed and updated as appropriate: allergies, current medications, past family history, past medical history, past social history, past surgical history, and problem list.  Review of Systems  Ears, nose, mouth, throat, and face: negative Respiratory: negative Gastrointestinal: negative Neurological: negative      Wt Readings from Last 3 Encounters:  06/14/23 197 lb (89.4 kg)  06/15/21 206 lb (93.4 kg)  04/07/21 208 lb (94.3 kg)   Ht Readings from Last 3 Encounters:  06/14/23 5\' 10"  (1.778 m)  06/15/21 5\' 10"  (1.778 m)  04/07/21 5\' 10"  (1.778 m)   Body mass index is 28.27 kg/m.  Facility age limit for growth %iles is 20 years. Facility age limit for growth %iles is 20 years. Growth %ile SmartLinks can only be used for patients less than 34 years old. Vision Screening   Right eye Left eye Both eyes  Without correction 20/20 20/30 20/20   With correction 20/20 20/30 20/20     Vitals:   06/14/23 0839  BP: 118/78  Pulse: (!) 50  Temp: 98 F (  36.7 C)  TempSrc: Temporal  SpO2: 96%  Weight: 197 lb (89.4 kg)  Height: 5\' 10"  (1.778 m)    GEN: awake, alert,  well-nourished EYES: EOMI, sclera clear, no icterus  Lungs: Clear to auscultation, unlabored breathing Heart: Normal PMI, regular rate & rhythm, normal S1,S2, no murmurs, rubs, or gallops Abdomen/Rectum: Normal scaphoid appearance, soft, non-tender, without organ enlargement or masses. Musculoskeletal: Normal symmetric bulk and strength Skin/Hair/Nails: No rashes or abnormal dyspigmentation    Permission granted to participate in athletics without restrictions. Form signed and returned to patient.  1. Anticipatory guidance discussed. Specific topics reviewed: drugs, ETOH, and tobacco, importance of regular exercise, importance of varied diet, seat belts, sex; STD and pregnancy prevention, and testicular self-exam.  2. Weight management: The patient was counseled regarding   3. Development: appropriate for age  9. Immunizations today: per orders. History of previous adverse reactions to immunizations? no  5. Follow-up visit in 1 year for next well child visit, or sooner as needed.    Andrew Austin was seen today for establish care and annual exam.  Diagnoses and all orders for this visit:   The above assessment and management plan was discussed with the patient. The patient verbalized understanding of and has agreed to the management plan. Patient is aware to call the clinic if symptoms fail to improve or worsen. Patient is aware when to return to the clinic for a follow-up visit. Patient educated on when it is appropriate to go to the emergency department.   Andrew Aran Santa Lighter, DNP [facility] [address] [place], [place] [address] [phone]

## 2023-06-15 ENCOUNTER — Encounter: Payer: Self-pay | Admitting: Nurse Practitioner

## 2023-06-15 LAB — CBC WITH DIFFERENTIAL/PLATELET
Basos: 1 %
Eos: 2 %
Immature Grans (Abs): 0 10*3/uL (ref 0.0–0.1)
Immature Granulocytes: 0 %
MCH: 29.5 pg (ref 26.6–33.0)
MCHC: 32.6 g/dL (ref 31.5–35.7)
MCV: 90 fL (ref 79–97)
Monocytes Absolute: 0.5 10*3/uL (ref 0.1–0.9)
Monocytes: 10 %
Neutrophils Absolute: 2.1 10*3/uL (ref 1.4–7.0)
Neutrophils: 41 %
Platelets: 179 10*3/uL (ref 150–450)
RBC: 4.55 x10E6/uL (ref 4.14–5.80)
RDW: 13.4 % (ref 11.6–15.4)
WBC: 5 10*3/uL (ref 3.4–10.8)

## 2023-06-15 LAB — LIPID PANEL
Cholesterol, Total: 216 mg/dL — ABNORMAL HIGH (ref 100–199)
HDL: 57 mg/dL (ref >39)
LDL Chol Calc (NIH): 153 mg/dL — ABNORMAL HIGH (ref 0–99)
Triglycerides: 36 mg/dL (ref 0–149)

## 2023-06-15 LAB — CMP14+EGFR
ALT: 38 IU/L (ref 0–44)
AST: 40 IU/L (ref 0–40)
Albumin: 4.5 g/dL (ref 4.3–5.2)
Alkaline Phosphatase: 69 IU/L (ref 44–121)
BUN/Creatinine Ratio: 19 (ref 9–20)
BUN: 25 mg/dL — ABNORMAL HIGH (ref 6–20)
Bilirubin Total: 0.5 mg/dL (ref 0.0–1.2)
CO2: 20 mmol/L (ref 20–29)
Calcium: 9.3 mg/dL (ref 8.7–10.2)
Chloride: 105 mmol/L (ref 96–106)
Creatinine, Ser: 1.31 mg/dL — ABNORMAL HIGH (ref 0.76–1.27)
Globulin, Total: 2.3 g/dL (ref 1.5–4.5)
Potassium: 4.1 mmol/L (ref 3.5–5.2)
Total Protein: 6.8 g/dL (ref 6.0–8.5)
eGFR: 79 mL/min/{1.73_m2} (ref >59)

## 2023-06-15 LAB — HEPB+HEPC+HIV PANEL
HIV Screen 4th Generation wRfx: NONREACTIVE
Hep B C IgM: NEGATIVE
Hep B Core Total Ab: NEGATIVE
Hep B E Ab: NONREACTIVE
Hep B E Ag: NEGATIVE
Hep C Virus Ab: NONREACTIVE

## 2023-06-15 LAB — THYROID PANEL WITH TSH: T3 Uptake Ratio: 32 % (ref 24–39)

## 2023-06-15 NOTE — Progress Notes (Signed)
Kidney function: BUN and creatinine are abnormal. Increase water intake or avoid pre-work and weight protein.  Lipid panel revealed a normal HDL (good cholesterol that protects against stroke and heart attack).  LDL was high (bad cholesterol that can put you at increased risk of stroke and heart attack).  Triglycerides were normal  (this level correlates with how much sugar/ carbohydrates you eat/drink).  To maintain healthy levels patient should continue to work on diet and exercise.  Eat plenty of fruits and vegetables.  Avoid saturated fats and high carbohydrate foods.  Get at least 30 minutes of exercise daily.  A cholesterol medication is not recommended at this time.  Weight management, diet, and exercise are essential. Ideal BMI is 18.5 - 24.9. You need to exercise for at least 30 minutes five times per week. Your diet should include fresh fruits and vegetables, fiber-rich whole grains, lean meats such as poultry and fatty fish, fat free or 1% dairy products, foods low in saturated and trans fats, foods low in cholesterol and added sugars, and foods low in sodium. You should use coconut or olive oil rather than vegetable oil. You should avoid fried foods. We will recheck this in 6-12 months to see if improvement has been made with these changes.

## 2024-08-13 ENCOUNTER — Telehealth: Payer: Self-pay | Admitting: Family Medicine

## 2024-08-13 NOTE — Telephone Encounter (Signed)
 Appt scheduled for pt.

## 2024-08-13 NOTE — Telephone Encounter (Signed)
 Copied from CRM [id number]. Topic: Clinical - Request for Lab/Test Order >> Aug 13, 2024  8:13 AM Alfonso ORN wrote: Reason for CRM: patient requesting to get a TB shot at his appt. 11/19/24 at  12:15 pm

## 2024-08-13 NOTE — Telephone Encounter (Signed)
 Called and spoke with patient to see what he was needing he thought his appt was for this month. 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 Advised that Sandra's nurse would have to call back to see if she could work him in.

## 2024-08-16 NOTE — Progress Notes (Signed)
 Complete physical exam  Patient: Andrew Austin   DOB: 10-29-2001   22 y.o. Male  MRN: [id number]  Subjective:    Chief Complaint  Patient presents with   Annual Exam    Andrew Austin is a 23 year old male presenting today for a comprehensive physical examination. He reports consuming a general diet and maintains an active exercise routine, including cardiovascular workouts, moderate to heavy weightlifting, and stair stepper sessions. He generally feels well and reports adequate sleep.  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 He reports previous participation in football but notes he started late at the school and will need to wait until the fall to resume playing. He will also require completion of a sports physical form.   Most recent fall risk assessment:    08/20/2024   12:28 PM  Fall Risk   Falls in the past year? 0  Number falls in past yr: 0  Injury with Fall? 0  Risk for fall due to : No Fall Risks     Most recent depression screenings:    08/20/2024   12:13 PM 06/14/2023    8:47 AM  PHQ 2/9 Scores  PHQ - 2 Score 0 0  PHQ- 9 Score 0 1    Vision:Within last year, Dental: No current dental problems, and STD: The patient denies history of sexually transmitted disease.  Patient Active Problem List   Diagnosis Date Noted   Screening-pulmonary TB 08/20/2024   Annual physical exam 08/20/2024   Routine sports physical exam 06/14/2023   Ingrown nail of great toe of right foot 04/07/2021   Encounter to establish care 07/03/2014   History reviewed. No pertinent past medical history. Past Surgical History:  Procedure Laterality Date   TENDON REPAIR     finger   Social History   Tobacco Use   Smoking status: Never   Smokeless tobacco: Never  Vaping Use   Vaping status: Never Used   Substance Use Topics   Alcohol use: No   Drug use: No   Social History   Socioeconomic History   Marital status: Single    Spouse name: Not on file   Number of children: Not on file   Years of education: Not on file   Highest education level: Not on file  Occupational History   Not on file  Tobacco Use   Smoking status: Never   Smokeless tobacco: Never  Vaping Use   Vaping status: Never Used  Substance and Sexual Activity   Alcohol use: No   Drug use: No   Sexual activity: Never  Other Topics Concern   Not on file  Social History Narrative   Not on file   Social Drivers of Health   Financial Resource Strain: Low Risk  (08/20/2024)   Overall Financial Resource Strain (CARDIA)    Difficulty of Paying Living Expenses: Not hard at all  Food Insecurity: Not on file  Transportation Needs: Not on file  Physical Activity: Sufficiently Active (08/20/2024)   Exercise Vital Sign    Days of Exercise per Week: 6 days    Minutes of Exercise per Session: 60 min  Stress: Not on file  Social Connections: Not on file  Intimate Partner Violence: Not on file   Family Status  Relation Name Status   Mother  Alive   Father  Alive  Brother  Alive  No partnership data on file   Family History  Problem Relation Age of Onset   Healthy Mother    Healthy Father    Healthy Brother    No Known Allergies    Patient Care Team: [facility] Hum[facility], Nena, NP as PCP - General (Nurse Practitioner)   No outpatient medications prior to visit.   No facility-administered medications prior to visit.    Review of Systems  Constitutional:  Negative for chills and fever.  HENT:  Negative for congestion and sore throat.   Respiratory:  Negative for cough and shortness of breath.   Cardiovascular:  Negative for chest pain and leg swelling.  Gastrointestinal:  Negative for abdominal pain, blood in stool, diarrhea, nausea and vomiting.  Musculoskeletal:  Negative for falls.  Skin:  Negative  for itching and rash.  Neurological:  Negative for dizziness and headaches.  Psychiatric/Behavioral:  Negative for depression, substance abuse and suicidal ideas. The patient does not have insomnia.    Negative unless indicated in HPI    Objective:     BP 133/83   Pulse (!) 50   Temp (!) 97 F (36.1 C) (Temporal)   Ht 5' 10 (1.778 m)   Wt 225 lb (102.1 kg)   SpO2 99%   BMI 32.28 kg/m  BP Readings from Last 3 Encounters:  08/20/24 133/83  06/14/23 118/78  06/15/21 131/65   Wt Readings from Last 3 Encounters:  08/20/24 225 lb (102.1 kg)  06/14/23 197 lb (89.4 kg)  06/15/21 206 lb (93.4 kg)      Physical Exam Vitals and nursing note reviewed.  Constitutional:      General: He is not in acute distress. HENT:     Head: Normocephalic and atraumatic.     Right Ear: Tympanic membrane, ear canal and external ear normal. There is no impacted cerumen.     Left Ear: Tympanic membrane, ear canal and external ear normal. There is no impacted cerumen.     Nose: Nose normal.     Mouth/Throat:     Mouth: Mucous membranes are moist.  Eyes:     General:        Left eye: No discharge.     Extraocular Movements: Extraocular movements intact.     Conjunctiva/sclera: Conjunctivae normal.     Pupils: Pupils are equal, round, and reactive to light.  Neck:     Vascular: No carotid bruit.  Cardiovascular:     Heart sounds: Normal heart sounds.  Pulmonary:     Effort: Pulmonary effort is normal.     Breath sounds: Normal breath sounds.  Abdominal:     Palpations: Abdomen is soft.  Musculoskeletal:        General: Normal range of motion.     Cervical back: Normal range of motion and neck supple. No rigidity or tenderness.     Right lower leg: No edema.     Left lower leg: No edema.  Lymphadenopathy:     Cervical: No cervical adenopathy.  Skin:    General: Skin is warm and dry.  Neurological:     Mental Status: He is alert and oriented to person, place, and time.  Psychiatric:         Mood and Affect: Mood normal.        Behavior: Behavior normal.        Thought Content: Thought content normal.        Judgment: Judgment normal.  No results found for any visits on 08/20/24. Last CBC Lab Results  Component Value Date   WBC 5.0 06/14/2023   HGB 13.4 06/14/2023   HCT 41.1 06/14/2023   MCV 90 06/14/2023   MCH 29.5 06/14/2023   RDW 13.4 06/14/2023   PLT 179 06/14/2023   Last metabolic panel Lab Results  Component Value Date   GLUCOSE 82 06/14/2023   NA 139 06/14/2023   K 4.1 06/14/2023   CL 105 06/14/2023   CO2 20 06/14/2023   BUN 25 (H) 06/14/2023   CREATININE 1.31 (H) 06/14/2023   EGFR 79 06/14/2023   CALCIUM 9.3 06/14/2023   PROT 6.8 06/14/2023   ALBUMIN 4.5 06/14/2023   LABGLOB 2.3 06/14/2023   AGRATIO 2.2 06/13/2020   BILITOT 0.5 06/14/2023   ALKPHOS 69 06/14/2023   AST 40 06/14/2023   ALT 38 06/14/2023   Last lipids Lab Results  Component Value Date   CHOL 216 (H) 06/14/2023   HDL 57 06/14/2023   LDLCALC 153 (H) 06/14/2023   TRIG 36 06/14/2023   CHOLHDL 3.8 06/14/2023   Last hemoglobin A1c Lab Results  Component Value Date   HGBA1C 5.4 06/14/2023   Last thyroid  functions Lab Results  Component Value Date   TSH 1.160 06/14/2023   T4TOTAL 7.5 06/14/2023        Assessment & Plan:    Routine Health Maintenance and Physical Exam  Discussed health benefits of physical activity, and encouraged him to engage in regular exercise appropriate for his age and condition.  Annual physical exam -     CBC with Differential/Platelet -     Comprehensive metabolic panel with GFR -     Lipid panel -     QuantiFERON-TB Gold Plus  Screening-pulmonary TB  Andrew Austin 23 year old African-American male seen today for annual physical, no acute distress Lab: CBC, CMP, lipid, TSH result pending TB screening: QuantiFERON test ordered result pending Future: will bring sport physical form to be filled out.    Return in about 1 year (around  08/20/2025) for physical with labs.     Zharia Conrow [place] Thompson, DNP [facility] [address] [place], [place] [address] [phone] Health Maintenance, Male Adopting a healthy lifestyle and getting preventive care are important in promoting health and wellness. Ask your health care provider about: The right schedule for you to have regular tests and exams. Things you can do on your own to prevent diseases and keep yourself healthy. What should I know about diet, weight, and exercise? Eat a healthy diet  Eat a diet that includes plenty of vegetables, fruits, low-fat dairy products, and lean protein. Do not eat a lot of foods that are high in solid fats, added sugars, or sodium. Maintain a healthy weight Body mass index (BMI) is a measurement that can be used to identify possible weight problems. It estimates body fat based on height and weight. Your health care provider can help determine your BMI and help you achieve or maintain a healthy weight. Get regular exercise Get regular exercise. This is one of the most important things you can do for your health. Most adults should: Exercise for at least 150 minutes each week. The exercise should increase your heart rate and make you sweat (moderate-intensity exercise). Do strengthening exercises at least twice a week. This is in addition to the moderate-intensity exercise. Spend less time sitting. Even light physical activity can be beneficial. Watch cholesterol and blood lipids Have your blood tested for  lipids and cholesterol at 24 years of age, then have this test every 5 years. You may need to have your cholesterol levels checked more often if: Your lipid or cholesterol levels are high. You are older than 24 years of age. You are at high risk for heart disease. What should I know about cancer screening? Many types of cancers can be detected early and may often be prevented. Depending on your health history  and family history, you may need to have cancer screening at various ages. This may include screening for: Colorectal cancer. Prostate cancer. Skin cancer. Lung cancer. What should I know about heart disease, diabetes, and high blood pressure? Blood pressure and heart disease High blood pressure causes heart disease and increases the risk of stroke. This is more likely to develop in people who have high blood pressure readings or are overweight. Talk with your health care provider about your target blood pressure readings. Have your blood pressure checked: Every 3-5 years if you are 9-26 years of age. Every year if you are 43 years old or older. If you are between the ages of 18 and 71 and are a current or former smoker, ask your health care provider if you should have a one-time screening for abdominal aortic aneurysm (AAA). Diabetes Have regular diabetes screenings. This checks your fasting blood sugar level. Have the screening done: Once every three years after age 80 if you are at a normal weight and have a low risk for diabetes. More often and at a younger age if you are overweight or have a high risk for diabetes. What should I know about preventing infection? Hepatitis B If you have a higher risk for hepatitis B, you should be screened for this virus. Talk with your health care provider to find out if you are at risk for hepatitis B infection. Hepatitis C Blood testing is recommended for: Everyone born from [birth year] through 1966. Anyone with known risk factors for hepatitis C. Sexually transmitted infections (STIs) You should be screened each year for STIs, including gonorrhea and chlamydia, if: You are sexually active and are younger than 24 years of age. You are older than 24 years of age and your health care provider tells you that you are at risk for this type of infection. Your sexual activity has changed since you were last screened, and you are at increased risk for chlamydia  or gonorrhea. Ask your health care provider if you are at risk. Ask your health care provider about whether you are at high risk for HIV. Your health care provider may recommend a prescription medicine to help prevent HIV infection. If you choose to take medicine to prevent HIV, you should first get tested for HIV. You should then be tested every 3 months for as long as you are taking the medicine. Follow these instructions at home: Alcohol use Do not drink alcohol if your health care provider tells you not to drink. If you drink alcohol: Limit how much you have to 0-2 drinks a day. Know how much alcohol is in your drink. In the [place] one drink equals one 12 oz bottle of beer (355 mL), one 5 oz glass of wine (148 mL), or one 1 oz glass of hard liquor (44 mL). Lifestyle Do not use any products that contain nicotine or tobacco. These products include cigarettes, chewing tobacco, and vaping devices, such as e-cigarettes. If you need help quitting, ask your health care provider. Do not use street drugs. Do not share  needles. Ask your health care provider for help if you need support or information about quitting drugs. General instructions Schedule regular health, dental, and eye exams. Stay current with your vaccines. Tell your health care provider if: You often feel depressed. You have ever been abused or do not feel safe at home. Summary Adopting a healthy lifestyle and getting preventive care are important in promoting health and wellness. Follow your health care provider's instructions about healthy diet, exercising, and getting tested or screened for diseases. Follow your health care provider's instructions on monitoring your cholesterol and blood pressure. This information is not intended to replace advice given to you by your health care provider. Make sure you discuss any questions you have with your health care provider. Document Revised: 04/06/2021 Document Reviewed:  04/06/2021 Elsevier Patient Education  2024 [employer]. Preventive Care 64-3 Years Old, Male Preventive care refers to lifestyle choices and visits with your health care provider that can promote health and wellness. Preventive care visits are also called wellness exams. What can I expect for my preventive care visit? Counseling During your preventive care visit, your health care provider may ask about your: Medical history, including: Past medical problems. Family medical history. Current health, including: Emotional well-being. Home life and relationship well-being. Sexual activity. Lifestyle, including: Alcohol, nicotine or tobacco, and drug use. Access to firearms. Diet, exercise, and sleep habits. Safety issues such as seatbelt and bike helmet use. Sunscreen use. Work and work Astronomer. Physical exam Your health care provider may check your: Height and weight. These may be used to calculate your BMI (body mass index). BMI is a measurement that tells if you are at a healthy weight. Waist circumference. This measures the distance around your waistline. This measurement also tells if you are at a healthy weight and may help predict your risk of certain diseases, such as type 2 diabetes and high blood pressure. Heart rate and blood pressure. Body temperature. Skin for abnormal spots. What immunizations do I need?  Vaccines are usually given at various ages, according to a schedule. Your health care provider will recommend vaccines for you based on your age, medical history, and lifestyle or other factors, such as travel or where you work. What tests do I need? Screening Your health care provider may recommend screening tests for certain conditions. This may include: Lipid and cholesterol levels. Diabetes screening. This is done by checking your blood sugar (glucose) after you have not eaten for a while (fasting). Hepatitis B test. Hepatitis C test. HIV (human  immunodeficiency virus) test. STI (sexually transmitted infection) testing, if you are at risk. Talk with your health care provider about your test results, treatment options, and if necessary, the need for more tests. Follow these instructions at home: Eating and drinking  Eat a healthy diet that includes fresh fruits and vegetables, whole grains, lean protein, and low-fat dairy products. Drink enough fluid to keep your urine pale yellow. Take vitamin and mineral supplements as recommended by your health care provider. Do not drink alcohol if your health care provider tells you not to drink. If you drink alcohol: Limit how much you have to 0-2 drinks a day. Know how much alcohol is in your drink. In the [place] one drink equals one 12 oz bottle of beer (355 mL), one 5 oz glass of wine (148 mL), or one 1 oz glass of hard liquor (44 mL). Lifestyle Brush your teeth every morning and night with fluoride toothpaste. Floss one time each day. Exercise for  at least 30 minutes 5 or more days each week. Do not use any products that contain nicotine or tobacco. These products include cigarettes, chewing tobacco, and vaping devices, such as e-cigarettes. If you need help quitting, ask your health care provider. Do not use drugs. If you are sexually active, practice safe sex. Use a condom or other form of protection to prevent STIs. Find healthy ways to manage stress, such as: Meditation, yoga, or listening to music. Journaling. Talking to a trusted person. Spending time with friends and family. Minimize exposure to UV radiation to reduce your risk of skin cancer. Safety Always wear your seat belt while driving or riding in a vehicle. Do not drive: If you have been drinking alcohol. Do not ride with someone who has been drinking. If you have been using any mind-altering substances or drugs. While texting. When you are tired or distracted. Wear a helmet and other protective equipment during sports  activities. If you have firearms in your house, make sure you follow all gun safety procedures. Seek help if you have been physically or sexually abused. What's next? Go to your health care provider once a year for an annual wellness visit. Ask your health care provider how often you should have your eyes and teeth checked. Stay up to date on all vaccines. This information is not intended to replace advice given to you by your health care provider. Make sure you discuss any questions you have with your health care provider. Document Revised: 05/13/2021 Document Reviewed: 05/13/2021 Elsevier Patient Education  2024 [employer].

## 2024-08-20 ENCOUNTER — Encounter: Payer: Self-pay | Admitting: Nurse Practitioner

## 2024-08-20 ENCOUNTER — Ambulatory Visit: Admitting: Nurse Practitioner

## 2024-08-20 VITALS — BP 133/83 | HR 50 | Temp 97.0°F | Ht 70.0 in | Wt 225.0 lb

## 2024-08-20 DIAGNOSIS — Z0000 Encounter for general adult medical examination without abnormal findings: Secondary | ICD-10-CM | POA: Diagnosis not present

## 2024-08-20 DIAGNOSIS — Z111 Encounter for screening for respiratory tuberculosis: Secondary | ICD-10-CM | POA: Diagnosis not present

## 2024-08-20 LAB — LIPID PANEL

## 2024-08-21 ENCOUNTER — Ambulatory Visit: Payer: Self-pay | Admitting: Nurse Practitioner

## 2024-08-23 LAB — COMPREHENSIVE METABOLIC PANEL WITH GFR
ALT: 34 IU/L (ref 0–44)
AST: 34 IU/L (ref 0–40)
Albumin: 4.3 g/dL (ref 4.3–5.2)
Alkaline Phosphatase: 74 IU/L (ref 47–123)
BUN/Creatinine Ratio: 10 (ref 9–20)
BUN: 13 mg/dL (ref 6–20)
Bilirubin Total: 0.3 mg/dL (ref 0.0–1.2)
CO2: 23 mmol/L (ref 20–29)
Calcium: 9.2 mg/dL (ref 8.7–10.2)
Chloride: 104 mmol/L (ref 96–106)
Creatinine, Ser: 1.24 mg/dL (ref 0.76–1.27)
Globulin, Total: 2.6 g/dL (ref 1.5–4.5)
Glucose: 91 mg/dL (ref 70–99)
Potassium: 4.6 mmol/L (ref 3.5–5.2)
Sodium: 141 mmol/L (ref 134–144)
Total Protein: 6.9 g/dL (ref 6.0–8.5)
eGFR: 84 mL/min/1.73 (ref >59)

## 2024-08-23 LAB — CBC WITH DIFFERENTIAL/PLATELET
Basophils Absolute: 0 x10E3/uL (ref 0.0–0.2)
Basos: 1 %
EOS (ABSOLUTE): 0.1 x10E3/uL (ref 0.0–0.4)
Eos: 2 %
Hematocrit: 43.8 % (ref 37.5–51.0)
Hemoglobin: 14 g/dL (ref 13.0–17.7)
Immature Grans (Abs): 0 x10E3/uL (ref 0.0–0.1)
Immature Granulocytes: 0 %
Lymphocytes Absolute: 2.1 x10E3/uL (ref 0.7–3.1)
Lymphs: 48 %
MCH: 29.7 pg (ref 26.6–33.0)
MCHC: 32 g/dL (ref 31.5–35.7)
MCV: 93 fL (ref 79–97)
Monocytes Absolute: 0.3 x10E3/uL (ref 0.1–0.9)
Monocytes: 7 %
Neutrophils Absolute: 1.9 x10E3/uL (ref 1.4–7.0)
Neutrophils: 42 %
Platelets: 171 x10E3/uL (ref 150–450)
RBC: 4.71 x10E6/uL (ref 4.14–5.80)
RDW: 13.6 % (ref 11.6–15.4)
WBC: 4.4 x10E3/uL (ref 3.4–10.8)

## 2024-08-23 LAB — QUANTIFERON-TB GOLD PLUS
QuantiFERON Mitogen Value: >10 [IU]/mL
QuantiFERON Nil Value: 0.05 [IU]/mL
QuantiFERON TB1 Ag Value: 0.05 [IU]/mL
QuantiFERON TB2 Ag Value: 0.05 [IU]/mL

## 2024-08-23 LAB — LIPID PANEL
Cholesterol, Total: 178 mg/dL (ref 100–199)
HDL: 48 mg/dL (ref >39)
LDL CALC COMMENT:: 3.7 ratio (ref 0.0–5.0)
LDL Chol Calc (NIH): 121 mg/dL — AB (ref 0–99)
Triglycerides: 44 mg/dL (ref 0–149)
VLDL Cholesterol Cal: 9 mg/dL (ref 5–40)

## 2024-11-19 ENCOUNTER — Encounter: Admitting: Nurse Practitioner
